# Patient Record
Sex: Male | Born: 1995 | Race: Black or African American | Hispanic: No | Marital: Single | Smoking: Current every day smoker
Health system: Southern US, Community
[De-identification: ages and names within clinical notes are randomized; demographics above are authoritative.]

---
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
## 2014-04-04 ENCOUNTER — Emergency Department (INDEPENDENT_AMBULATORY_CARE_PROVIDER_SITE_OTHER)
Admission: EM | Admit: 2014-04-04 | Discharge: 2014-04-04 | Disposition: A | Discharge status: Home / Self Care | Payer: Medicaid Other | Source: Home / Self Care | Attending: Family Medicine | Admitting: Family Medicine

## 2014-04-04 ENCOUNTER — Encounter (HOSPITAL_COMMUNITY): Payer: Self-pay | Admitting: Emergency Medicine

## 2014-04-04 DIAGNOSIS — B360 Pityriasis versicolor: Secondary | ICD-10-CM

## 2014-04-04 MED ORDER — TERBINAFINE HCL 250 MG PO TABS: 250.0000 mg | ORAL_TABLET | Freq: Every day | ORAL | Status: AC

## 2014-04-04 NOTE — Discharge Instructions (Signed)
Take medicine and use selsun or nizoral shampoo on rash area daily when you take the pills.

## 2014-04-04 NOTE — ED Notes (Signed)
Pt      Has   A  Rash  On  Upper  Torso    That  Has  Been  Present  Over 1  Year    He  denya  Any  Itching     He  Is  In no  Acute  Distress     It appears  That the  Pigment is  Involved

## 2014-04-04 NOTE — ED Provider Notes (Signed)
CSN: [id number]     Arrival date & time 04/04/14  1153 History   First MD Initiated Contact with Patient 04/04/14 1313     Chief Complaint  Patient presents with  . Rash   (Consider location/radiation/quality/duration/timing/severity/associated sxs/prior Treatment) Patient is a 18 y.o. male presenting with rash. The history is provided by the patient and a parent.  Rash Location:  Shoulder/arm and torso Shoulder/arm rash location:  L upper arm, R upper arm, R shoulder and L shoulder Quality: dryness, itchiness and scaling   Severity:  Mild Onset quality:  Gradual Duration:  12 months Progression:  Spreading Chronicity:  New Ineffective treatments:  Antihistamines and topical steroids Associated symptoms: no fever     History reviewed. No pertinent past medical history. History reviewed. No pertinent past surgical history. History reviewed. No pertinent family history. History  Substance Use Topics  . Smoking status: Not on file  . Smokeless tobacco: Not on file  . Alcohol Use: No    Review of Systems  Constitutional: Negative.  Negative for fever.  Skin: Positive for rash.    Allergies  Review of patient's allergies indicates no known allergies.  Home Medications   Prior to Admission medications   Medication Sig Start Date End Date Taking? Authorizing Provider  terbinafine (LAMISIL) 250 MG tablet Take 1 tablet (250 mg total) by mouth daily. For 2 weeks then daily for 1 week in oct and 1 week in nov. 04/04/14   Linna Hoff, MD   BP 119/72  Pulse 46  Temp(Src) 98.3 F (36.8 C) (Oral)  Resp 12  SpO2 98% Physical Exam  Nursing note and vitals reviewed. Constitutional: He is oriented to person, place, and time. He appears well-developed and well-nourished.  Neurological: He is alert and oriented to person, place, and time.  Skin: Skin is warm and dry. Rash noted.  Patchy, depigmented irreg nontender rash across upper back and shoulders bilat.    ED Course   Procedures (including critical care time) Labs Review Labs Reviewed - No data to display  Imaging Review No results found.   MDM   1. Tinea versicolor        Linna Hoff, MD 04/04/14 [phone]

## 2015-09-14 ENCOUNTER — Encounter (HOSPITAL_COMMUNITY): Payer: Self-pay | Admitting: *Deleted

## 2015-09-14 ENCOUNTER — Emergency Department (HOSPITAL_COMMUNITY): Payer: Medicaid Other

## 2015-09-14 ENCOUNTER — Emergency Department (HOSPITAL_COMMUNITY)
Admission: EM | Admit: 2015-09-14 | Discharge: 2015-09-14 | Disposition: A | Discharge status: Home / Self Care | Payer: Medicaid Other | Attending: Emergency Medicine | Admitting: Emergency Medicine

## 2015-09-14 DIAGNOSIS — S0031XA Abrasion of nose, initial encounter: Secondary | ICD-10-CM | POA: Insufficient documentation

## 2015-09-14 DIAGNOSIS — S0081XA Abrasion of other part of head, initial encounter: Secondary | ICD-10-CM | POA: Diagnosis not present

## 2015-09-14 DIAGNOSIS — Y999 Unspecified external cause status: Secondary | ICD-10-CM | POA: Insufficient documentation

## 2015-09-14 DIAGNOSIS — Z79899 Other long term (current) drug therapy: Secondary | ICD-10-CM | POA: Diagnosis not present

## 2015-09-14 DIAGNOSIS — S0990XA Unspecified injury of head, initial encounter: Secondary | ICD-10-CM | POA: Diagnosis present

## 2015-09-14 DIAGNOSIS — F17210 Nicotine dependence, cigarettes, uncomplicated: Secondary | ICD-10-CM | POA: Diagnosis not present

## 2015-09-14 DIAGNOSIS — Z23 Encounter for immunization: Secondary | ICD-10-CM | POA: Insufficient documentation

## 2015-09-14 DIAGNOSIS — Y92410 Unspecified street and highway as the place of occurrence of the external cause: Secondary | ICD-10-CM | POA: Diagnosis not present

## 2015-09-14 DIAGNOSIS — Y9389 Activity, other specified: Secondary | ICD-10-CM | POA: Diagnosis not present

## 2015-09-14 DIAGNOSIS — R519 Headache, unspecified: Secondary | ICD-10-CM

## 2015-09-14 DIAGNOSIS — R51 Headache: Secondary | ICD-10-CM

## 2015-09-14 MED ORDER — IBUPROFEN 800 MG PO TABS
800.0000 mg | ORAL_TABLET | Freq: Three times a day (TID) | ORAL | Status: AC | PRN
Start: 2015-09-14 — End: ?

## 2015-09-14 MED ORDER — TETANUS-DIPHTH-ACELL PERTUSSIS 5-2.5-18.5 LF-MCG/0.5 IM SUSP
0.5000 mL | Freq: Once | INTRAMUSCULAR | Status: AC
  Administered 2015-09-14: 0.5 mL via INTRAMUSCULAR
  Filled 2015-09-14: qty 0.5

## 2015-09-14 MED ORDER — METHOCARBAMOL 500 MG PO TABS: 500.0000 mg | ORAL_TABLET | Freq: Four times a day (QID) | ORAL | Status: AC | PRN

## 2015-09-14 NOTE — ED Notes (Signed)
Pt ambulating independently w/ steady gait on d/c in no acute distress, A&Ox4. D/c instructions reviewed w/ pt and family - pt and family deny any further questions or concerns at present. Rx given x2  
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
## 2016-08-24 ENCOUNTER — Observation Stay (HOSPITAL_COMMUNITY)
Admission: EM | Admit: 2016-08-24 | Discharge: 2016-08-25 | Disposition: A | Discharge status: Home / Self Care | Payer: Self-pay | Attending: Surgery | Admitting: Surgery

## 2016-08-24 ENCOUNTER — Emergency Department (HOSPITAL_COMMUNITY): Payer: Self-pay

## 2016-08-24 ENCOUNTER — Encounter (HOSPITAL_COMMUNITY): Payer: Self-pay | Admitting: Radiology

## 2016-08-24 DIAGNOSIS — S61421A Laceration with foreign body of right hand, initial encounter: Secondary | ICD-10-CM | POA: Insufficient documentation

## 2016-08-24 DIAGNOSIS — R52 Pain, unspecified: Secondary | ICD-10-CM

## 2016-08-24 DIAGNOSIS — W3400XA Accidental discharge from unspecified firearms or gun, initial encounter: Secondary | ICD-10-CM | POA: Insufficient documentation

## 2016-08-24 DIAGNOSIS — F17200 Nicotine dependence, unspecified, uncomplicated: Secondary | ICD-10-CM | POA: Insufficient documentation

## 2016-08-24 DIAGNOSIS — S32592A Other specified fracture of left pubis, initial encounter for closed fracture: Principal | ICD-10-CM | POA: Insufficient documentation

## 2016-08-24 LAB — CBC
HEMATOCRIT: 39.7 % (ref 39.0–52.0)
HEMOGLOBIN: 13.2 g/dL (ref 13.0–17.0)
MCH: 33.5 pg (ref 26.0–34.0)
MCHC: 33.2 g/dL (ref 30.0–36.0)
MCV: 100.8 fL — AB (ref 78.0–100.0)
Platelets: 199 10*3/uL (ref 150–400)
RBC: 3.94 MIL/uL — ABNORMAL LOW (ref 4.22–5.81)
RDW: 12.4 % (ref 11.5–15.5)
WBC: 15.6 10*3/uL — ABNORMAL HIGH (ref 4.0–10.5)

## 2016-08-24 LAB — COMPREHENSIVE METABOLIC PANEL
ALBUMIN: 4.1 g/dL (ref 3.5–5.0)
ALT: 26 U/L (ref 17–63)
ANION GAP: 12 (ref 5–15)
AST: 27 U/L (ref 15–41)
Alkaline Phosphatase: 54 U/L (ref 38–126)
BUN: 6 mg/dL (ref 6–20)
CO2: 23 mmol/L (ref 22–32)
Calcium: 9.1 mg/dL (ref 8.9–10.3)
Chloride: 108 mmol/L (ref 101–111)
Creatinine, Ser: 1 mg/dL (ref 0.61–1.24)
GFR calc Af Amer: >60 mL/min (ref >60)
GFR calc non Af Amer: >60 mL/min (ref >60)
GLUCOSE: 122 mg/dL — AB (ref 65–99)
POTASSIUM: 3.6 mmol/L (ref 3.5–5.1)
SODIUM: 143 mmol/L (ref 135–145)
Total Bilirubin: 1.2 mg/dL (ref 0.3–1.2)
Total Protein: 6.3 g/dL — ABNORMAL LOW (ref 6.5–8.1)

## 2016-08-24 LAB — PREPARE FRESH FROZEN PLASMA
Blood Product Expiration Date: 201802152359
Blood Product Expiration Date: 201802152359
ISSUE DATE / TIME: 201802111530
ISSUE DATE / TIME: 201802111530
Unit Type and Rh: 6200
Unit Type and Rh: 6200

## 2016-08-24 LAB — URINALYSIS, ROUTINE W REFLEX MICROSCOPIC
Bacteria, UA: NONE SEEN
Bilirubin Urine: NEGATIVE
Glucose, UA: NEGATIVE mg/dL
Ketones, ur: NEGATIVE mg/dL
Leukocytes, UA: NEGATIVE
Nitrite: NEGATIVE
Protein, ur: NEGATIVE mg/dL
SQUAMOUS EPITHELIAL / LPF: NONE SEEN
Specific Gravity, Urine: 1.036 — ABNORMAL HIGH (ref 1.005–1.030)
pH: 6 (ref 5.0–8.0)

## 2016-08-24 LAB — I-STAT CG4 LACTIC ACID, ED: LACTIC ACID, VENOUS: 2.53 mmol/L — AB (ref 0.5–1.9)

## 2016-08-24 LAB — I-STAT CHEM 8, ED
BUN: 6 mg/dL (ref 6–20)
CALCIUM ION: 1.16 mmol/L (ref 1.15–1.40)
CHLORIDE: 104 mmol/L (ref 101–111)
Creatinine, Ser: 1 mg/dL (ref 0.61–1.24)
Glucose, Bld: 119 mg/dL — ABNORMAL HIGH (ref 65–99)
HCT: 39 % (ref 39.0–52.0)
Hemoglobin: 13.3 g/dL (ref 13.0–17.0)
Potassium: 3.5 mmol/L (ref 3.5–5.1)
SODIUM: 145 mmol/L (ref 135–145)
TCO2: 28 mmol/L (ref 0–100)

## 2016-08-24 LAB — PROTIME-INR
INR: 0.96
PROTHROMBIN TIME: 12.8 s (ref 11.4–15.2)

## 2016-08-24 LAB — CDS SEROLOGY

## 2016-08-24 LAB — ETHANOL: Alcohol, Ethyl (B): <5 mg/dL (ref <5)

## 2016-08-24 MED ORDER — ONDANSETRON HCL 4 MG/2ML IJ SOLN: 4.0000 mg | Freq: Four times a day (QID) | INTRAMUSCULAR | Status: DC | PRN

## 2016-08-24 MED ORDER — HYDROMORPHONE HCL 2 MG/ML IJ SOLN
1.0000 mg | INTRAMUSCULAR | Status: DC | PRN
  Administered 2016-08-25 (×4): 1 mg via INTRAVENOUS
  Filled 2016-08-24 (×4): qty 1

## 2016-08-24 MED ORDER — SODIUM CHLORIDE 0.9 % IV SOLN
INTRAVENOUS | Status: DC
  Administered 2016-08-24 – 2016-08-25 (×2): via INTRAVENOUS

## 2016-08-24 MED ORDER — ENOXAPARIN SODIUM 40 MG/0.4ML ~~LOC~~ SOLN
40.0000 mg | SUBCUTANEOUS | Status: DC
  Administered 2016-08-25: 40 mg via SUBCUTANEOUS
  Filled 2016-08-24: qty 0.4

## 2016-08-24 MED ORDER — ONDANSETRON HCL 4 MG PO TABS: 4.0000 mg | ORAL_TABLET | Freq: Four times a day (QID) | ORAL | Status: DC | PRN

## 2016-08-24 MED ORDER — OXYCODONE HCL 5 MG PO TABS
5.0000 mg | ORAL_TABLET | ORAL | Status: DC | PRN
  Administered 2016-08-24 – 2016-08-25 (×4): 5 mg via ORAL
  Filled 2016-08-24 (×4): qty 1

## 2016-08-24 MED ORDER — OXYCODONE-ACETAMINOPHEN 5-325 MG PO TABS
2.0000 | ORAL_TABLET | Freq: Once | ORAL | Status: AC
  Administered 2016-08-24: 2 via ORAL
  Filled 2016-08-24: qty 2

## 2016-08-24 MED ORDER — IOPAMIDOL (ISOVUE-300) INJECTION 61%
100.0000 mL | Freq: Once | INTRAVENOUS | Status: AC | PRN
  Administered 2016-08-24: 100 mL via INTRAVENOUS

## 2016-08-24 MED ORDER — FENTANYL CITRATE (PF) 100 MCG/2ML IJ SOLN
50.0000 ug | Freq: Once | INTRAMUSCULAR | Status: AC
  Administered 2016-08-24: 50 ug via INTRAVENOUS
  Filled 2016-08-24: qty 2

## 2016-08-24 MED ORDER — LIDOCAINE HCL (PF) 1 % IJ SOLN
5.0000 mL | Freq: Once | INTRAMUSCULAR | Status: AC
  Administered 2016-08-24: 5 mL via INTRADERMAL
  Filled 2016-08-24: qty 5

## 2016-08-24 MED ORDER — TETANUS-DIPHTH-ACELL PERTUSSIS 5-2.5-18.5 LF-MCG/0.5 IM SUSP
0.5000 mL | Freq: Once | INTRAMUSCULAR | Status: AC
  Administered 2016-08-24: 0.5 mL via INTRAMUSCULAR
  Filled 2016-08-24: qty 0.5

## 2016-08-24 MED ORDER — CEFAZOLIN IN D5W 1 GM/50ML IV SOLN
1.0000 g | Freq: Once | INTRAVENOUS | Status: AC
  Administered 2016-08-24: 1 g via INTRAVENOUS
  Filled 2016-08-24: qty 50
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
## 2016-08-24 NOTE — ED Notes (Signed)
Rt hand washed with soap and water  Multiple small cuts

## 2016-08-24 NOTE — Progress Notes (Signed)
Orthopedic Tech Progress Note Patient Details:  Iline OvenKentry L White-Long 07/07/1996 [id number]  Patient ID: Iline OvenKentry L White-Long, male   DOB: 07/07/1996, 21 y.o.   MRN: [id number]   Saul FordyceJennifer C Lynsee Wands 08/24/2016, 4:09 PMLevel 1 Trauma.
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
## 2016-08-24 NOTE — ED Notes (Signed)
To ct

## 2016-08-24 NOTE — ED Notes (Signed)
[facility] still not allowing visitors

## 2016-08-24 NOTE — ED Notes (Signed)
REPORT CALLED TO RN ON 6N
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
## 2016-08-24 NOTE — ED Notes (Signed)
Returned from ct 

## 2016-08-24 NOTE — ED Notes (Signed)
I attempted to give report  Chg has not assigned the pt  Will caLL ME BACK

## 2016-08-24 NOTE — ED Notes (Signed)
pts father is on the phone  He lives in town  coming

## 2016-08-24 NOTE — ED Notes (Signed)
Port chest and pelcis
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
## 2016-08-25 DIAGNOSIS — W3400XA Accidental discharge from unspecified firearms or gun, initial encounter: Secondary | ICD-10-CM

## 2016-08-25 DIAGNOSIS — S32592A Other specified fracture of left pubis, initial encounter for closed fracture: Secondary | ICD-10-CM

## 2016-08-25 DIAGNOSIS — S61421A Laceration with foreign body of right hand, initial encounter: Secondary | ICD-10-CM

## 2016-08-25 LAB — TYPE AND SCREEN
ABO/RH(D): A POS
Antibody Screen: NEGATIVE
UNIT DIVISION: 0
UNIT DIVISION: 0

## 2016-08-25 LAB — BASIC METABOLIC PANEL
Anion gap: 7 (ref 5–15)
BUN: <5 mg/dL — ABNORMAL LOW (ref 6–20)
CHLORIDE: 104 mmol/L (ref 101–111)
CO2: 26 mmol/L (ref 22–32)
CREATININE: 0.77 mg/dL (ref 0.61–1.24)
Calcium: 8.4 mg/dL — ABNORMAL LOW (ref 8.9–10.3)
GFR calc non Af Amer: >60 mL/min (ref >60)
Glucose, Bld: 84 mg/dL (ref 65–99)
Potassium: 3.3 mmol/L — ABNORMAL LOW (ref 3.5–5.1)
Sodium: 137 mmol/L (ref 135–145)

## 2016-08-25 LAB — CBC
HEMATOCRIT: 35.6 % — AB (ref 39.0–52.0)
Hemoglobin: 11.8 g/dL — ABNORMAL LOW (ref 13.0–17.0)
MCH: 33 pg (ref 26.0–34.0)
MCHC: 33.1 g/dL (ref 30.0–36.0)
MCV: 99.4 fL (ref 78.0–100.0)
Platelets: 173 10*3/uL (ref 150–400)
RBC: 3.58 MIL/uL — ABNORMAL LOW (ref 4.22–5.81)
RDW: 12.6 % (ref 11.5–15.5)
WBC: 7 10*3/uL (ref 4.0–10.5)

## 2016-08-25 LAB — BLOOD PRODUCT ORDER (VERBAL) VERIFICATION

## 2016-08-25 MED ORDER — OXYCODONE HCL 5 MG PO TABS: 5.0000 mg | ORAL_TABLET | ORAL | 0 refills | Status: AC | PRN

## 2016-08-25 MED ORDER — OXYCODONE HCL 5 MG PO TABS
5.0000 mg | ORAL_TABLET | ORAL | Status: DC | PRN
  Administered 2016-08-25: 10 mg via ORAL
  Filled 2016-08-25: qty 2
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
## 2016-08-26 ENCOUNTER — Encounter (HOSPITAL_COMMUNITY): Payer: Self-pay | Admitting: *Deleted

## 2016-08-29 ENCOUNTER — Encounter (HOSPITAL_COMMUNITY): Payer: Self-pay | Admitting: Nurse Practitioner

## 2016-08-29 ENCOUNTER — Emergency Department (HOSPITAL_COMMUNITY)
Admission: EM | Admit: 2016-08-29 | Discharge: 2016-08-29 | Disposition: A | Discharge status: Home / Self Care | Payer: Medicaid Other | Attending: Dermatology | Admitting: Dermatology

## 2016-08-29 DIAGNOSIS — Z5321 Procedure and treatment not carried out due to patient leaving prior to being seen by health care provider: Secondary | ICD-10-CM | POA: Insufficient documentation

## 2016-08-29 DIAGNOSIS — F17210 Nicotine dependence, cigarettes, uncomplicated: Secondary | ICD-10-CM | POA: Insufficient documentation

## 2016-08-29 DIAGNOSIS — M79605 Pain in left leg: Secondary | ICD-10-CM | POA: Insufficient documentation
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
## 2016-08-29 NOTE — ED Notes (Signed)
Called for patient x3 without answer.

## 2016-08-30 ENCOUNTER — Encounter (HOSPITAL_COMMUNITY): Payer: Self-pay | Admitting: Emergency Medicine

## 2016-08-30 ENCOUNTER — Emergency Department (HOSPITAL_COMMUNITY)
Admission: EM | Admit: 2016-08-30 | Discharge: 2016-08-30 | Disposition: A | Discharge status: Home / Self Care | Payer: Medicaid Other | Attending: Emergency Medicine | Admitting: Emergency Medicine

## 2016-08-30 DIAGNOSIS — F17210 Nicotine dependence, cigarettes, uncomplicated: Secondary | ICD-10-CM | POA: Insufficient documentation

## 2016-08-30 DIAGNOSIS — Y939 Activity, unspecified: Secondary | ICD-10-CM | POA: Insufficient documentation

## 2016-08-30 DIAGNOSIS — S31829A Unspecified open wound of left buttock, initial encounter: Secondary | ICD-10-CM | POA: Insufficient documentation

## 2016-08-30 DIAGNOSIS — S31109A Unspecified open wound of abdominal wall, unspecified quadrant without penetration into peritoneal cavity, initial encounter: Secondary | ICD-10-CM | POA: Insufficient documentation

## 2016-08-30 DIAGNOSIS — Y929 Unspecified place or not applicable: Secondary | ICD-10-CM | POA: Insufficient documentation

## 2016-08-30 DIAGNOSIS — W3400XA Accidental discharge from unspecified firearms or gun, initial encounter: Secondary | ICD-10-CM

## 2016-08-30 DIAGNOSIS — Y999 Unspecified external cause status: Secondary | ICD-10-CM | POA: Insufficient documentation

## 2016-08-30 DIAGNOSIS — Z79899 Other long term (current) drug therapy: Secondary | ICD-10-CM | POA: Insufficient documentation

## 2016-08-30 MED ORDER — TRAMADOL HCL 50 MG PO TABS: 50.0000 mg | ORAL_TABLET | Freq: Three times a day (TID) | ORAL | 0 refills | Status: AC | PRN
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
## 2016-09-02 ENCOUNTER — Other Ambulatory Visit (HOSPITAL_COMMUNITY): Payer: Self-pay | Admitting: General Surgery

## 2016-09-02 ENCOUNTER — Telehealth (HOSPITAL_COMMUNITY): Payer: Self-pay

## 2016-09-02 DIAGNOSIS — L02214 Cutaneous abscess of groin: Secondary | ICD-10-CM

## 2016-09-02 MED ORDER — SULFAMETHOXAZOLE-TRIMETHOPRIM 800-160 MG PO TABS: 1.0000 | ORAL_TABLET | Freq: Two times a day (BID) | ORAL | 1 refills | Status: DC
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
## 2016-09-19 ENCOUNTER — Encounter (HOSPITAL_COMMUNITY): Payer: Self-pay

## 2016-09-19 ENCOUNTER — Emergency Department (HOSPITAL_COMMUNITY)
Admission: EM | Admit: 2016-09-19 | Discharge: 2016-09-19 | Disposition: A | Discharge status: Home / Self Care | Payer: Self-pay | Attending: Emergency Medicine | Admitting: Emergency Medicine

## 2016-09-19 ENCOUNTER — Emergency Department (HOSPITAL_COMMUNITY): Payer: Self-pay

## 2016-09-19 DIAGNOSIS — S93402A Sprain of unspecified ligament of left ankle, initial encounter: Secondary | ICD-10-CM | POA: Insufficient documentation

## 2016-09-19 DIAGNOSIS — F17210 Nicotine dependence, cigarettes, uncomplicated: Secondary | ICD-10-CM | POA: Insufficient documentation

## 2016-09-19 DIAGNOSIS — Y9301 Activity, walking, marching and hiking: Secondary | ICD-10-CM | POA: Insufficient documentation

## 2016-09-19 DIAGNOSIS — Y9289 Other specified places as the place of occurrence of the external cause: Secondary | ICD-10-CM | POA: Insufficient documentation

## 2016-09-19 DIAGNOSIS — W109XXA Fall (on) (from) unspecified stairs and steps, initial encounter: Secondary | ICD-10-CM | POA: Insufficient documentation

## 2016-09-19 DIAGNOSIS — Y999 Unspecified external cause status: Secondary | ICD-10-CM | POA: Insufficient documentation

## 2016-09-19 MED ORDER — IBUPROFEN 400 MG PO TABS
600.0000 mg | ORAL_TABLET | Freq: Once | ORAL | Status: AC
  Administered 2016-09-19: 600 mg via ORAL
  Filled 2016-09-19: qty 1

## 2016-09-19 NOTE — ED Triage Notes (Signed)
Pt reports he twisted his left ankle last night when walking down some steps.

## 2016-09-19 NOTE — Discharge Instructions (Signed)
Motrin and Tylenol for pain and swelling. Please rest, ice, elevate the left ankle. Follow-up with orthopedist in 2-3 days if symptoms are not improving. Return to ED if symptoms worsen.
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
## 2016-09-19 NOTE — Progress Notes (Signed)
Orthopedic Tech Progress Note Patient Details:  Bobby Johns Bobby Johns 1996/01/28 [id number]  Ortho Devices Type of Ortho Device: ASO, Crutches Ortho Device/Splint Location: LLE Ortho Device/Splint Interventions: Ordered, Application   Jennye MoccasinHughes, Carroll Ranney Craig 09/19/2016, 3:20 PM
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
## 2020-12-03 ENCOUNTER — Ambulatory Visit (HOSPITAL_COMMUNITY): Admit: 2020-12-03 | Discharge status: Against Medical Advice | Payer: Self-pay

## 2020-12-12 ENCOUNTER — Ambulatory Visit (HOSPITAL_COMMUNITY)
Admission: RE | Admit: 2020-12-12 | Discharge: 2020-12-12 | Disposition: A | Discharge status: Home / Self Care | Payer: Self-pay | Source: Ambulatory Visit | Attending: Family Medicine | Admitting: Family Medicine

## 2020-12-12 ENCOUNTER — Encounter (HOSPITAL_COMMUNITY): Payer: Self-pay

## 2020-12-12 ENCOUNTER — Other Ambulatory Visit: Payer: Self-pay

## 2020-12-12 VITALS — BP 120/78 | HR 63 | Temp 99.1°F | Resp 19

## 2020-12-12 DIAGNOSIS — Z711 Person with feared health complaint in whom no diagnosis is made: Secondary | ICD-10-CM | POA: Insufficient documentation

## 2020-12-12 DIAGNOSIS — Z113 Encounter for screening for infections with a predominantly sexual mode of transmission: Secondary | ICD-10-CM | POA: Insufficient documentation

## 2020-12-12 LAB — HIV ANTIBODY (ROUTINE TESTING W REFLEX): HIV Screen 4th Generation wRfx: NONREACTIVE

## 2020-12-12 MED ORDER — DOXYCYCLINE HYCLATE 100 MG PO CAPS: 100.0000 mg | ORAL_CAPSULE | Freq: Two times a day (BID) | ORAL | 0 refills | Status: AC

## 2020-12-12 MED ORDER — CEFTRIAXONE SODIUM 500 MG IJ SOLR
500.0000 mg | Freq: Once | INTRAMUSCULAR | Status: AC
  Administered 2020-12-12: 500 mg via INTRAMUSCULAR

## 2020-12-12 MED ORDER — CEFTRIAXONE SODIUM 500 MG IJ SOLR
INTRAMUSCULAR | Status: AC
Start: 2020-12-12 — End: ?
  Filled 2020-12-12: qty 500

## 2020-12-12 MED ORDER — LIDOCAINE HCL (PF) 1 % IJ SOLN
INTRAMUSCULAR | Status: AC
  Filled 2020-12-12: qty 2

## 2020-12-12 NOTE — ED Triage Notes (Signed)
Pt in for routine std testing  Denies any penile sx

## 2020-12-12 NOTE — ED Provider Notes (Signed)
[facility]   [id number] 12/12/20 Arrival Time: 7425  ASSESSMENT & PLAN:  1. Concern about STD in male without diagnosis   2. Screen for STD (sexually transmitted disease)    Meds ordered this encounter  Medications  . cefTRIAXone (ROCEPHIN) injection 500 mg  . doxycycline (VIBRAMYCIN) 100 MG capsule    Sig: Take 1 capsule (100 mg total) by mouth 2 (two) times daily.    Dispense:  14 capsule    Refill:  0      Discharge Instructions     You have been given the following today for treatment of suspected gonorrhea and/or chlamydia:  cefTRIAXone (ROCEPHIN) injection 500 mg  Please pick up your prescription for doxycycline 100 mg and begin taking twice daily for the next seven (7) days.  Even though we have treated you today, we have sent testing for sexually transmitted infections. We will notify you of any positive results once they are received. If required, we will prescribe any medications you might need.  Please refrain from all sexual activity for at least the next seven days.     Pending: Labs Reviewed  RPR  HIV ANTIBODY (ROUTINE TESTING W REFLEX)  CYTOLOGY, (ORAL, ANAL, URETHRAL) ANCILLARY ONLY    Will notify of any positive results. Instructed to refrain from sexual activity for at least seven days.  Reviewed expectations re: course of current medical issues. Questions answered. Outlined signs and symptoms indicating need for more acute intervention. Patient verbalized understanding. After Visit Summary given.   SUBJECTIVE:  Bobby Johns is a 25 y.o. male who requests STD testing. "Think I have something". Describes penile "tingling feeling" without penile discharge. Onset gradual. First noticed few d ago. Denies: urinary frequency, dysuria and gross hematuria. Afebrile. No abdominal or pelvic pain. No n/v. No rashes or lesions. Reports that he is sexually active with single male partner. OTC treatment:  none.   OBJECTIVE:  Vitals:   12/12/20 1009  BP: 120/78  Pulse: 63  Resp: 19  Temp: 99.1 F (37.3 C)  TempSrc: Oral  SpO2: 97%    General appearance: alert, cooperative, appears stated age and no distress Throat: lips, mucosa, and tongue normal; teeth and gums normal Lungs: unlabored respirations; speaks full sentences without difficulty Back: no CVA tenderness; FROM at waist Abdomen: soft, non-tender GU: normal appearing genitalia Skin: warm and dry Psychological: alert and cooperative; normal mood and affect.  No results found for this or any previous visit.  Labs Reviewed  RPR  HIV ANTIBODY (ROUTINE TESTING W REFLEX)  CYTOLOGY, (ORAL, ANAL, URETHRAL) ANCILLARY ONLY    No Known Allergies  History reviewed. No pertinent past medical history. History reviewed. No pertinent family history. Social History   Socioeconomic History  . Marital status: Single    Spouse name: Not on file  . Number of children: Not on file  . Years of education: Not on file  . Highest education level: Not on file  Occupational History  . Not on file  Tobacco Use  . Smoking status: Current Every Day Smoker    Packs/day: 0.50    Types: Cigarettes  . Smokeless tobacco: Never Used  Substance and Sexual Activity  . Alcohol use: Yes  . Drug use: No    Types: Marijuana  . Sexual activity: Yes  Other Topics Concern  . Not on file  Social History Narrative   ** Merged History Encounter **       Social Determinants of Health   Financial Resource Strain:  Not on file  Food Insecurity: Not on file  Transportation Needs: Not on file  Physical Activity: Not on file  Stress: Not on file  Social Connections: Not on file  Intimate Partner Violence: Not on file          Mardella Layman, MD 12/12/20 1243

## 2020-12-12 NOTE — Discharge Instructions (Addendum)

## 2020-12-13 LAB — CYTOLOGY, (ORAL, ANAL, URETHRAL) ANCILLARY ONLY
Chlamydia: POSITIVE — AB
Comment: NEGATIVE
Comment: NEGATIVE
Comment: NORMAL
Neisseria Gonorrhea: NEGATIVE
Trichomonas: NEGATIVE

## 2020-12-13 LAB — RPR: RPR Ser Ql: NONREACTIVE
